# Patient Record
Sex: Male | Born: 1985 | Race: Black or African American | Hispanic: No | Marital: Single | Smoking: Never smoker
Health system: Southern US, Community
[De-identification: ages and names within clinical notes are randomized; demographics above are authoritative.]

## PROBLEM LIST (undated history)

## (undated) DIAGNOSIS — N62 Hypertrophy of breast: Secondary | ICD-10-CM

## (undated) DIAGNOSIS — T7840XA Allergy, unspecified, initial encounter: Secondary | ICD-10-CM

## (undated) DIAGNOSIS — R945 Abnormal results of liver function studies: Secondary | ICD-10-CM

## (undated) DIAGNOSIS — R7989 Other specified abnormal findings of blood chemistry: Secondary | ICD-10-CM

## (undated) HISTORY — DX: Hypertrophy of breast: N62

## (undated) HISTORY — DX: Allergy, unspecified, initial encounter: T78.40XA

---
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
## 2012-04-05 ENCOUNTER — Other Ambulatory Visit: Payer: Self-pay | Admitting: Family Medicine

## 2012-04-18 ENCOUNTER — Ambulatory Visit (HOSPITAL_COMMUNITY)
Admission: RE | Admit: 2012-04-18 | Discharge: 2012-04-18 | Disposition: A | Discharge status: Home / Self Care | Payer: 59 | Source: Ambulatory Visit | Attending: Family Medicine | Admitting: Family Medicine

## 2012-04-18 DIAGNOSIS — N63 Unspecified lump in unspecified breast: Secondary | ICD-10-CM | POA: Insufficient documentation

## 2012-11-19 ENCOUNTER — Ambulatory Visit (INDEPENDENT_AMBULATORY_CARE_PROVIDER_SITE_OTHER): Payer: 59 | Admitting: Physician Assistant

## 2012-11-19 VITALS — BP 118/72 | HR 62 | Temp 98.1°F | Resp 16 | Ht 67.0 in | Wt 191.0 lb

## 2012-11-19 DIAGNOSIS — R42 Dizziness and giddiness: Secondary | ICD-10-CM

## 2012-11-19 DIAGNOSIS — J069 Acute upper respiratory infection, unspecified: Secondary | ICD-10-CM

## 2012-11-19 MED ORDER — TRIAMCINOLONE ACETONIDE(NASAL) 55 MCG/ACT NA INHA: 2.0000 | Freq: Every day | NASAL | Status: DC

## 2012-11-19 MED ORDER — GUAIFENESIN ER 1200 MG PO TB12: 1.0000 | ORAL_TABLET | Freq: Two times a day (BID) | ORAL | Status: AC

## 2012-11-19 NOTE — Progress Notes (Signed)
  Subjective:    Patient ID: Tyler Brooks, male    DOB: August 04, 1985, 27 y.o.   MRN: [id number]  HPI Tyler Brooks is a 27 YO African American male presenting with dizziness which started yesterday (Sunday).  He describes feeling like he is about to fall - but denies vertigo and lightheadedness.  The sensation is relieved when he sits down.   Two days ago (Saturday), his allergies were worse.  He had increased sneezing and nasal discharge.  Used generic Allergy Tablets with improvement.    He is also experiencing a subjective fever, nausea, belching, and regurgitation.  He had a bout of constipation last week but had a bowel movement this morning.  His normal BM schedule is every other day.  Denies tinnitus, ear pain, vomiting, abdominal pain, anorexia, heartburn, chest pain, shortness of breath, diarrhea.  No hearing loss.  No HA.  Past medical history, surgical history, medications, allergies, social history and family history have been reviewed.  Review of Systems As stated in HPI - otherwise negative.    Objective:   Physical Exam Filed Vitals:   11/19/12 1215  BP: 118/72  Pulse: 62  Temp: 98.1 F (36.7 C)  TempSrc: Oral  Resp: 16  Height: 5\' 7"  (1.702 m)  Weight: 191 lb (86.637 kg)  SpO2: 100%   General:  WDWN male in no acute distress. Skin:  Soft, warm skin with good turgor.  No bruising or lesions present.  HEENT:   Head - normocephalic, no visible or palpable masses.  Eyes - conjunctivae clear, sclera white.    Ears - EACs have moderate cerumen present.  Right TMs was mildly dull.  Hearing intact.   Nose - Patent.  Nasal mucosa is pale and boggy - R>L.  Septum midline.   Mouth/Throat - Mucous membranes are moist and pink.  No obvious periodontal disease.   No tonsillar hypertrophy or exudate.   Neck - supple without lymphadenopathy or thyromegaly. Cardiovascular: S1 and S2 distinct with no murmurs, rubs or gallops. Respiratory:  CTA with no adventitious  sounds. Abdominal:  Soft, non-tender abdomen with no visible pulsations or masses.  Bowel sounds adequate.   Neuro:  Cranial nerves grossly intact.  No nystagmus present.  Negative romberg.    Assessment & Plan:  1. Dizziness 2. URI, acute  I am unsure the exact etiology of his dizziness but his exam is reassuring that this is related to either a cold or allergies.  He will continue to hydrate to decrease chances of dehydration causing worsening symptoms and use meds as directed to treat his congestion which is most likely causing ETD resulting in a dizzy sensation for the patient.  Prescribe: - Guaifenesin (MUCINEX MAXIMUM STRENGTH) 1200 MG TB12; Take 1 tablet (1,200 mg total) by mouth 2 (two) times daily.  Dispense: 14 each; Refill: 0 - triamcinolone (NASACORT) 55 MCG/ACT nasal inhaler; Place 2 sprays into the nose daily. 2 sprays each nostril daily.  Dispense: 1 Inhaler; Refill: 0  Stay hydrated.  Work note for today given.  Call office if symptoms do not steadily improve or any acute worsening.

## 2012-11-19 NOTE — Progress Notes (Signed)
I was directly involved with the patient care. I examined the patient and was involved in the treatment plan.

## 2013-02-19 ENCOUNTER — Other Ambulatory Visit: Payer: Self-pay | Admitting: Physical Medicine and Rehabilitation

## 2013-02-19 ENCOUNTER — Ambulatory Visit
Admission: RE | Admit: 2013-02-19 | Discharge: 2013-02-19 | Disposition: A | Discharge status: Home / Self Care | Payer: No Typology Code available for payment source | Source: Ambulatory Visit | Attending: Physical Medicine and Rehabilitation | Admitting: Physical Medicine and Rehabilitation

## 2013-02-19 DIAGNOSIS — Z0000 Encounter for general adult medical examination without abnormal findings: Secondary | ICD-10-CM

## 2013-09-17 ENCOUNTER — Ambulatory Visit (INDEPENDENT_AMBULATORY_CARE_PROVIDER_SITE_OTHER): Payer: 59 | Admitting: Family Medicine

## 2013-09-17 ENCOUNTER — Encounter: Payer: Self-pay | Admitting: Family Medicine

## 2013-09-17 VITALS — BP 122/68 | HR 76 | Temp 99.0°F | Resp 14 | Ht 68.0 in | Wt 171.0 lb

## 2013-09-17 DIAGNOSIS — Z0000 Encounter for general adult medical examination without abnormal findings: Secondary | ICD-10-CM

## 2013-09-17 NOTE — Progress Notes (Signed)
Subjective:    Patient ID: Tyler Brooks, male    DOB: 1986-05-04, 28 y.o.   MRN: [id number]  HPI Patient today for complete physical exam. He has no concerns. He is very healthy. Since his last office visit the patient has lost substantial weight. He is producing greater than 200 pounds. Today he is 171 pounds and has a BMI of 26. He is exercising regularly. He does not smoke. He does not abuse alcohol. Past Medical History  Diagnosis Date  . Allergy   . Gynecomastia, male 2000    bilateral, R > L - "getting better as a result of weight loss"   Current Outpatient Prescriptions on File Prior to Visit  Medication Sig Dispense Refill  . ibuprofen (ADVIL,MOTRIN) 100 MG tablet Take 100 mg by mouth every 6 (six) hours as needed for fever.       No current facility-administered medications on file prior to visit.   No Known Allergies No past surgical history on file. History   Social History  . Marital Status: Single    Spouse Name: N/A    Number of Children: N/A  . Years of Education: N/A   Occupational History  .      Case Picker, [employer]   Social History Main Topics  . Smoking status: Never Smoker   . Smokeless tobacco: Never Used  . Alcohol Use: Yes     Comment: Occas.  . Drug Use: No  . Sexual Activity: Not on file     Comment: works at [employer], 1 child   Other Topics Concern  . Not on file   Social History Narrative   Lives with girlfriend   Family History  Problem Relation Age of Onset  . Diverticulitis Mother   . Diabetes Father   . Hypertension Father   . Hypertension Paternal Grandmother   . Diabetes Paternal Grandmother   . Cancer Maternal Aunt 45    breast  . COPD Maternal Grandmother       Review of Systems  All other systems reviewed and are negative.      Objective:   Physical Exam  Vitals reviewed. Constitutional: He is oriented to person, place, and time. He appears well-developed and well-nourished. No distress.  HENT:   Head: Normocephalic and atraumatic.  Right Ear: External ear normal.  Left Ear: External ear normal.  Nose: Nose normal.  Mouth/Throat: No oropharyngeal exudate.  Eyes: Conjunctivae and EOM are normal. Pupils are equal, round, and reactive to light. Right eye exhibits no discharge. Left eye exhibits no discharge. No scleral icterus.  Neck: Normal range of motion. Neck supple. No JVD present. No tracheal deviation present. No thyromegaly present.  Cardiovascular: Normal rate, regular rhythm, normal heart sounds and intact distal pulses.  Exam reveals no gallop and no friction rub.   No murmur heard. Pulmonary/Chest: Effort normal and breath sounds normal. No stridor. No respiratory distress. He has no wheezes. He has no rales. He exhibits no tenderness.  Abdominal: Soft. Bowel sounds are normal. He exhibits no distension and no mass. There is no tenderness. There is no rebound and no guarding.  Genitourinary: Penis normal. Guaiac negative stool. No penile tenderness.  Musculoskeletal: Normal range of motion. He exhibits no edema and no tenderness.  Lymphadenopathy:    He has no cervical adenopathy.  Neurological: He is alert and oriented to person, place, and time. He has normal reflexes. He displays normal reflexes. No cranial nerve deficit. He exhibits normal muscle tone. Coordination  normal.  Skin: Skin is warm. No rash noted. He is not diaphoretic. No erythema. No pallor.  Psychiatric: He has a normal mood and affect. His behavior is normal. Judgment and thought content normal.   patient has several keloids.        Assessment & Plan:  1. Routine general medical examination at a health care facility Physical exam is completely normal. His blood pressure is excellent. His tetanus vaccine is up to date. The remainder of his preventative care is normal. His testicular exam is normal. I recommended he return fasting for CBC, CMP, and fasting lipid panel. Otherwise recheck in one year.

## 2013-09-26 ENCOUNTER — Other Ambulatory Visit: Payer: 59

## 2013-09-26 LAB — CBC WITH DIFFERENTIAL/PLATELET
Basophils Absolute: 0 10*3/uL (ref 0.0–0.1)
Basophils Relative: 1 % (ref 0–1)
EOS ABS: 0.2 10*3/uL (ref 0.0–0.7)
EOS PCT: 5 % (ref 0–5)
HCT: 39.3 % (ref 39.0–52.0)
HEMOGLOBIN: 13.8 g/dL (ref 13.0–17.0)
LYMPHS ABS: 1.8 10*3/uL (ref 0.7–4.0)
LYMPHS PCT: 61 % — AB (ref 12–46)
MCH: 31.2 pg (ref 26.0–34.0)
MCHC: 35.1 g/dL (ref 30.0–36.0)
MCV: 88.9 fL (ref 78.0–100.0)
MONOS PCT: 7 % (ref 3–12)
Monocytes Absolute: 0.2 10*3/uL (ref 0.1–1.0)
Neutro Abs: 0.8 10*3/uL — ABNORMAL LOW (ref 1.7–7.7)
Neutrophils Relative %: 26 % — ABNORMAL LOW (ref 43–77)
Platelets: 242 10*3/uL (ref 150–400)
RBC: 4.42 MIL/uL (ref 4.22–5.81)
RDW: 13.2 % (ref 11.5–15.5)
WBC: 3 10*3/uL — AB (ref 4.0–10.5)

## 2013-09-26 LAB — COMPLETE METABOLIC PANEL WITH GFR
ALT: 41 U/L (ref 0–53)
AST: 43 U/L — ABNORMAL HIGH (ref 0–37)
Albumin: 4.6 g/dL (ref 3.5–5.2)
Alkaline Phosphatase: 59 U/L (ref 39–117)
BUN: 12 mg/dL (ref 6–23)
CO2: 30 mEq/L (ref 19–32)
Calcium: 9.5 mg/dL (ref 8.4–10.5)
Chloride: 101 mEq/L (ref 96–112)
Creat: 1 mg/dL (ref 0.50–1.35)
GFR, Est African American: >89 mL/min
GFR, Est Non African American: >89 mL/min
Glucose, Bld: 76 mg/dL (ref 70–99)
Potassium: 4.4 mEq/L (ref 3.5–5.3)
Sodium: 138 mEq/L (ref 135–145)
Total Bilirubin: 0.6 mg/dL (ref 0.2–1.2)
Total Protein: 7.3 g/dL (ref 6.0–8.3)

## 2013-09-26 LAB — LIPID PANEL
Cholesterol: 128 mg/dL (ref 0–200)
HDL: 66 mg/dL (ref >39)
LDL Cholesterol: 46 mg/dL (ref 0–99)
Total CHOL/HDL Ratio: 1.9 Ratio
Triglycerides: 81 mg/dL (ref <150)
VLDL: 16 mg/dL (ref 0–40)

## 2015-01-08 ENCOUNTER — Encounter: Payer: Self-pay | Admitting: Family Medicine

## 2015-01-29 ENCOUNTER — Ambulatory Visit (INDEPENDENT_AMBULATORY_CARE_PROVIDER_SITE_OTHER): Payer: 59 | Admitting: Family Medicine

## 2015-01-29 ENCOUNTER — Other Ambulatory Visit: Payer: Self-pay | Admitting: Family Medicine

## 2015-01-29 ENCOUNTER — Encounter: Payer: Self-pay | Admitting: Family Medicine

## 2015-01-29 VITALS — BP 114/70 | HR 68 | Temp 98.3°F | Resp 18 | Ht 67.75 in | Wt 182.0 lb

## 2015-01-29 DIAGNOSIS — Z0000 Encounter for general adult medical examination without abnormal findings: Secondary | ICD-10-CM

## 2015-01-29 DIAGNOSIS — Z7251 High risk heterosexual behavior: Secondary | ICD-10-CM

## 2015-01-29 DIAGNOSIS — K644 Residual hemorrhoidal skin tags: Secondary | ICD-10-CM

## 2015-01-29 DIAGNOSIS — K648 Other hemorrhoids: Secondary | ICD-10-CM | POA: Diagnosis not present

## 2015-01-29 LAB — COMPLETE METABOLIC PANEL WITH GFR
ALT: 40 U/L (ref 9–46)
AST: 104 U/L — ABNORMAL HIGH (ref 10–40)
Albumin: 4.2 g/dL (ref 3.6–5.1)
Alkaline Phosphatase: 46 U/L (ref 40–115)
BUN: 14 mg/dL (ref 7–25)
CO2: 29 mmol/L (ref 20–31)
CREATININE: 0.94 mg/dL (ref 0.60–1.35)
Calcium: 9.2 mg/dL (ref 8.6–10.3)
Chloride: 103 mmol/L (ref 98–110)
GFR, Est Non African American: >89 mL/min (ref >=60)
Glucose, Bld: 90 mg/dL (ref 70–99)
Potassium: 3.9 mmol/L (ref 3.5–5.3)
Sodium: 139 mmol/L (ref 135–146)
Total Bilirubin: 0.8 mg/dL (ref 0.2–1.2)
Total Protein: 6.8 g/dL (ref 6.1–8.1)

## 2015-01-29 LAB — LIPID PANEL
Cholesterol: 152 mg/dL (ref 125–200)
HDL: 77 mg/dL (ref >=40)
LDL Cholesterol: 66 mg/dL (ref <130)
TRIGLYCERIDES: 43 mg/dL (ref <150)
Total CHOL/HDL Ratio: 2 Ratio (ref <=5.0)
VLDL: 9 mg/dL (ref <30)

## 2015-01-29 MED ORDER — HYDROCORTISONE 2.5 % RE CREA: 1.0000 "application " | TOPICAL_CREAM | Freq: Two times a day (BID) | RECTAL | Status: DC

## 2015-01-29 NOTE — Progress Notes (Signed)
Subjective:    Patient ID: Tyler Brooks, male    DOB: 1985/08/15, 29 y.o.   MRN: [id number]  HPI  Patient today for complete physical exam. He has no concerns. He is very healthy. Patient is trying to become a model. He is exercising regularly. He is lifting weights however he is doing it in a healthy fashion alternating muscle groups and resting his body in between. He is also focusing on healthy diet filled with healthy proteins. His only concern is occasional bright red blood per rectum. On physical exam today there is a external hemorrhoid at 5:00 externally which is likely the source of this. He is also requesting STD testing although he is asymptomatic. Past Medical History  Diagnosis Date  . Allergy   . Gynecomastia, male 2000    bilateral, R > L - "getting better as a result of weight loss"   Current Outpatient Prescriptions on File Prior to Visit  Medication Sig Dispense Refill  . ibuprofen (ADVIL,MOTRIN) 100 MG tablet Take 100 mg by mouth every 6 (six) hours as needed for fever.     No current facility-administered medications on file prior to visit.   No Known Allergies No past surgical history on file. Social History   Social History  . Marital Status: Single    Spouse Name: N/A  . Number of Children: N/A  . Years of Education: N/A   Occupational History  .      Case Picker, [employer]   Social History Main Topics  . Smoking status: Never Smoker   . Smokeless tobacco: Never Used  . Alcohol Use: Yes     Comment: Occas.  . Drug Use: No  . Sexual Activity: Not on file     Comment: works at [employer], 1 child   Other Topics Concern  . Not on file   Social History Narrative   Lives with girlfriend   Family History  Problem Relation Age of Onset  . Diverticulitis Mother   . Diabetes Father   . Hypertension Father   . Hypertension Paternal Grandmother   . Diabetes Paternal Grandmother   . Cancer Maternal Aunt 45    breast  . COPD Maternal  Grandmother       Review of Systems  All other systems reviewed and are negative.      Objective:   Physical Exam  Constitutional: He is oriented to person, place, and time. He appears well-developed and well-nourished. No distress.  HENT:  Head: Normocephalic and atraumatic.  Right Ear: External ear normal.  Left Ear: External ear normal.  Nose: Nose normal.  Mouth/Throat: No oropharyngeal exudate.  Eyes: Conjunctivae and EOM are normal. Pupils are equal, round, and reactive to light. Right eye exhibits no discharge. Left eye exhibits no discharge. No scleral icterus.  Neck: Normal range of motion. Neck supple. No JVD present. No tracheal deviation present. No thyromegaly present.  Cardiovascular: Normal rate, regular rhythm, normal heart sounds and intact distal pulses.  Exam reveals no gallop and no friction rub.   No murmur heard. Pulmonary/Chest: Effort normal and breath sounds normal. No stridor. No respiratory distress. He has no wheezes. He has no rales. He exhibits no tenderness.  Abdominal: Soft. Bowel sounds are normal. He exhibits no distension and no mass. There is no tenderness. There is no rebound and no guarding.  Genitourinary: Penis normal. Guaiac negative stool. No penile tenderness.  Musculoskeletal: Normal range of motion. He exhibits no edema or tenderness.  Lymphadenopathy:  He has no cervical adenopathy.  Neurological: He is alert and oriented to person, place, and time. He has normal reflexes. No cranial nerve deficit. He exhibits normal muscle tone. Coordination normal.  Skin: Skin is warm. No rash noted. He is not diaphoretic. No erythema. No pallor.  Psychiatric: He has a normal mood and affect. His behavior is normal. Judgment and thought content normal.  Vitals reviewed.  patient has several keloids.        Assessment & Plan:  1. Routine general medical examination at a health care facility Physical exam is completely normal. His blood pressure  is excellent. His tetanus vaccine is up to date. The remainder of his preventative care is normal. His testicular exam is normal.  I will check a CBC, CMP, fasting lipid panel. I gave the patient prescription for Anusol HC cream to be applied twice daily for up to one week for the external hemorrhoid. I will also perform STD testing per the patient's request. He declines a flu shot.

## 2015-01-29 NOTE — Addendum Note (Signed)
Addended by: Alean Rinne A on: 01/29/2015 08:37 AM   Modules accepted: Orders

## 2015-01-30 LAB — CBC WITH DIFFERENTIAL/PLATELET
Basophils Absolute: 0 10*3/uL (ref 0.0–0.1)
Basophils Relative: 1 % (ref 0–1)
EOS ABS: 0.1 10*3/uL (ref 0.0–0.7)
Eosinophils Relative: 3 % (ref 0–5)
HEMATOCRIT: 37.7 % — AB (ref 39.0–52.0)
Hemoglobin: 13.6 g/dL (ref 13.0–17.0)
LYMPHS ABS: 1.4 10*3/uL (ref 0.7–4.0)
LYMPHS PCT: 56 % — AB (ref 12–46)
MCH: 32.6 pg (ref 26.0–34.0)
MCHC: 36.1 g/dL — ABNORMAL HIGH (ref 30.0–36.0)
MCV: 90.4 fL (ref 78.0–100.0)
MPV: 10 fL (ref 8.6–12.4)
Monocytes Absolute: 0.2 10*3/uL (ref 0.1–1.0)
Monocytes Relative: 9 % (ref 3–12)
NEUTROS ABS: 0.8 10*3/uL — AB (ref 1.7–7.7)
NEUTROS PCT: 31 % — AB (ref 43–77)
PLATELETS: 217 10*3/uL (ref 150–400)
RBC: 4.17 MIL/uL — ABNORMAL LOW (ref 4.22–5.81)
RDW: 13.2 % (ref 11.5–15.5)
WBC: 2.5 10*3/uL — ABNORMAL LOW (ref 4.0–10.5)

## 2015-01-30 LAB — PATHOLOGIST SMEAR REVIEW

## 2015-01-30 LAB — GC/CHLAMYDIA PROBE AMP, URINE
CHLAMYDIA, SWAB/URINE, PCR: NEGATIVE
GC PROBE AMP, URINE: NEGATIVE

## 2015-01-30 LAB — RPR

## 2015-01-30 LAB — HIV ANTIBODY (ROUTINE TESTING W REFLEX): HIV: NONREACTIVE

## 2015-02-03 ENCOUNTER — Other Ambulatory Visit: Payer: 59

## 2015-02-03 DIAGNOSIS — R7989 Other specified abnormal findings of blood chemistry: Secondary | ICD-10-CM

## 2015-02-03 DIAGNOSIS — R945 Abnormal results of liver function studies: Principal | ICD-10-CM

## 2015-02-03 LAB — HEPATITIS PANEL, ACUTE
HCV AB: NEGATIVE
HCV AB: NEGATIVE
HEP A IGM: NONREACTIVE
HEP B C IGM: NONREACTIVE
Hepatitis B Surface Ag: NEGATIVE

## 2015-12-08 ENCOUNTER — Ambulatory Visit (INDEPENDENT_AMBULATORY_CARE_PROVIDER_SITE_OTHER): Payer: 59 | Admitting: Family Medicine

## 2015-12-08 ENCOUNTER — Encounter: Payer: Self-pay | Admitting: Family Medicine

## 2015-12-08 VITALS — BP 110/76 | HR 76 | Temp 98.6°F | Resp 14 | Ht 68.0 in | Wt 207.0 lb

## 2015-12-08 DIAGNOSIS — R7989 Other specified abnormal findings of blood chemistry: Secondary | ICD-10-CM | POA: Diagnosis not present

## 2015-12-08 DIAGNOSIS — Z7251 High risk heterosexual behavior: Secondary | ICD-10-CM

## 2015-12-08 DIAGNOSIS — R945 Abnormal results of liver function studies: Secondary | ICD-10-CM

## 2015-12-08 NOTE — Progress Notes (Signed)
Subjective:    Patient ID: Tyler Brooks, male    DOB: 1986-03-22, 30 y.o.   MRN: [id number]  HPI  9/16 Patient today for complete physical exam. He has no concerns. He is very healthy. Patient is trying to become a model. He is exercising regularly. He is lifting weights however he is doing it in a healthy fashion alternating muscle groups and resting his body in between. He is also focusing on healthy diet filled with healthy proteins. His only concern is occasional bright red blood per rectum. On physical exam today there is a external hemorrhoid at 5:00 externally which is likely the source of this. He is also requesting STD testing although he is asymptomatic.  At that time, my plan was: 1. Routine general medical examination at a health care facility Physical exam is completely normal. His blood pressure is excellent. His tetanus vaccine is up to date. The remainder of his preventative care is normal. His testicular exam is normal.  I will check a CBC, CMP, fasting lipid panel. I gave the patient prescription for Anusol HC cream to be applied twice daily for up to one week for the external hemorrhoid. I will also perform STD testing per the patient's request. He declines a flu shot.  12/08/15 At the last appt, The patient was found to have significant elevations in his AST. Viral hepatitis tests were normal. Never got right upper quadrant ultrasound. I assumed AST was elevated due to muscle inflammation as he was actively bodybuilding at the time. He is not drinking alcohol. He is not taking any Tylenol. I would like to repeat that today. He is also requesting screening for all STDs. He is asymptomatic but he is starting a new relationship and would like testing prior to starting a relationship Past Medical History:  Diagnosis Date  . Allergy   . Gynecomastia, male 2000   bilateral, R > L - "getting better as a result of weight loss"   No current outpatient prescriptions on file prior  to visit.   No current facility-administered medications on file prior to visit.    No Known Allergies No past surgical history on file. Social History   Social History  . Marital status: Single    Spouse name: N/A  . Number of children: N/A  . Years of education: N/A   Occupational History  .      Case Picker, [employer]   Social History Main Topics  . Smoking status: Never Smoker  . Smokeless tobacco: Never Used  . Alcohol use Yes     Comment: Occas.  . Drug use: No  . Sexual activity: Not on file     Comment: works at [employer], 1 child   Other Topics Concern  . Not on file   Social History Narrative   Lives with girlfriend   Family History  Problem Relation Age of Onset  . Diverticulitis Mother   . Diabetes Father   . Hypertension Father   . Hypertension Paternal Grandmother   . Diabetes Paternal Grandmother   . Cancer Maternal Aunt 45    breast  . COPD Maternal Grandmother       Review of Systems  All other systems reviewed and are negative.      Objective:   Physical Exam  Constitutional: He is oriented to person, place, and time. He appears well-developed and well-nourished. No distress.  HENT:  Head: Normocephalic and atraumatic.  Right Ear: External ear normal.  Left Ear:  External ear normal.  Nose: Nose normal.  Mouth/Throat: No oropharyngeal exudate.  Eyes: Conjunctivae and EOM are normal. Pupils are equal, round, and reactive to light. Right eye exhibits no discharge. Left eye exhibits no discharge. No scleral icterus.  Neck: Normal range of motion. Neck supple. No JVD present. No tracheal deviation present. No thyromegaly present.  Cardiovascular: Normal rate, regular rhythm, normal heart sounds and intact distal pulses.  Exam reveals no gallop and no friction rub.   No murmur heard. Pulmonary/Chest: Effort normal and breath sounds normal. No stridor. No respiratory distress. He has no wheezes. He has no rales. He exhibits no  tenderness.  Abdominal: Soft. Bowel sounds are normal. He exhibits no distension and no mass. There is no tenderness. There is no rebound and no guarding.  Genitourinary: Penis normal. Rectal exam shows guaiac negative stool. No penile tenderness.  Musculoskeletal: Normal range of motion. He exhibits no edema or tenderness.  Lymphadenopathy:    He has no cervical adenopathy.  Neurological: He is alert and oriented to person, place, and time. He has normal reflexes. No cranial nerve deficit. He exhibits normal muscle tone. Coordination normal.  Skin: Skin is warm. No rash noted. He is not diaphoretic. No erythema. No pallor.  Psychiatric: He has a normal mood and affect. His behavior is normal. Judgment and thought content normal.  Vitals reviewed.  patient has several keloids.        Assessment & Plan:  High risk sexual behavior - Plan: GC/Chlamydia Probe Amp, RPR, HIV antibody, HSV(herpes smplx)abs-1+2(IgG+IgM)-bld  Elevated LFTs - Plan: COMPLETE METABOLIC PANEL WITH GFR  In accordance with his wishes I will screen the patient for STDs including HIV, syphilis, HSV, gonorrhea, chlamydia. I will also repeat a CMP to monitor his liver function tests and his AST remains elevated, I would obtain a right upper quadrant ultrasound

## 2015-12-09 LAB — SYPHILIS: RPR W/REFLEX TO RPR TITER AND TREPONEMAL ANTIBODIES, TRADITIONAL SCREENING AND DIAGNOSIS ALGORITHM

## 2015-12-09 LAB — COMPLETE METABOLIC PANEL WITH GFR
ALT: 26 U/L (ref 9–46)
AST: 26 U/L (ref 10–40)
Albumin: 4.4 g/dL (ref 3.6–5.1)
Alkaline Phosphatase: 63 U/L (ref 40–115)
BILIRUBIN TOTAL: 0.8 mg/dL (ref 0.2–1.2)
BUN: 16 mg/dL (ref 7–25)
CO2: 27 mmol/L (ref 20–31)
Calcium: 8.9 mg/dL (ref 8.6–10.3)
Chloride: 104 mmol/L (ref 98–110)
Creat: 1.28 mg/dL (ref 0.60–1.35)
GFR, EST AFRICAN AMERICAN: 86 mL/min (ref >=60)
GFR, EST NON AFRICAN AMERICAN: 75 mL/min (ref >=60)
Glucose, Bld: 69 mg/dL — ABNORMAL LOW (ref 70–99)
Potassium: 4.5 mmol/L (ref 3.5–5.3)
Sodium: 140 mmol/L (ref 135–146)
Total Protein: 7 g/dL (ref 6.1–8.1)

## 2015-12-09 LAB — GC/CHLAMYDIA PROBE AMP
CT PROBE, AMP APTIMA: NOT DETECTED
GC PROBE AMP APTIMA: NOT DETECTED

## 2015-12-09 LAB — HIV ANTIBODY (ROUTINE TESTING W REFLEX): HIV: NONREACTIVE

## 2015-12-10 LAB — HSV(HERPES SMPLX)ABS-I+II(IGG+IGM)-BLD
HSV 1 Glycoprotein G Ab, IgG: <0.9 {index}
HSV 2 Glycoprotein G Ab, IgG: <0.9 {index}
Herpes Simplex Vrs I&II-IgM Ab (EIA): 0.23 {index}

## 2016-02-01 ENCOUNTER — Encounter: Payer: 59 | Admitting: Family Medicine

## 2016-05-10 ENCOUNTER — Emergency Department (HOSPITAL_BASED_OUTPATIENT_CLINIC_OR_DEPARTMENT_OTHER): Payer: Worker's Compensation

## 2016-05-10 ENCOUNTER — Encounter (HOSPITAL_BASED_OUTPATIENT_CLINIC_OR_DEPARTMENT_OTHER): Payer: Self-pay | Admitting: Emergency Medicine

## 2016-05-10 ENCOUNTER — Emergency Department (HOSPITAL_BASED_OUTPATIENT_CLINIC_OR_DEPARTMENT_OTHER)
Admission: EM | Admit: 2016-05-10 | Discharge: 2016-05-11 | Disposition: A | Discharge status: Home / Self Care | Payer: Worker's Compensation | Attending: Emergency Medicine | Admitting: Emergency Medicine

## 2016-05-10 DIAGNOSIS — Y929 Unspecified place or not applicable: Secondary | ICD-10-CM | POA: Diagnosis not present

## 2016-05-10 DIAGNOSIS — S93402A Sprain of unspecified ligament of left ankle, initial encounter: Secondary | ICD-10-CM

## 2016-05-10 DIAGNOSIS — X501XXA Overexertion from prolonged static or awkward postures, initial encounter: Secondary | ICD-10-CM | POA: Diagnosis not present

## 2016-05-10 DIAGNOSIS — Y990 Civilian activity done for income or pay: Secondary | ICD-10-CM | POA: Diagnosis not present

## 2016-05-10 DIAGNOSIS — Y9389 Activity, other specified: Secondary | ICD-10-CM | POA: Insufficient documentation

## 2016-05-10 DIAGNOSIS — S99912A Unspecified injury of left ankle, initial encounter: Secondary | ICD-10-CM | POA: Diagnosis present

## 2016-05-10 HISTORY — DX: Other specified abnormal findings of blood chemistry: R79.89

## 2016-05-10 HISTORY — DX: Abnormal results of liver function studies: R94.5

## 2016-05-10 MED ORDER — OXYCODONE-ACETAMINOPHEN 5-325 MG PO TABS
1.0000 | ORAL_TABLET | Freq: Once | ORAL | Status: AC
  Administered 2016-05-10: 1 via ORAL
  Filled 2016-05-10: qty 1

## 2016-05-10 NOTE — ED Notes (Signed)
Pts Supervisor states no drug screen needed

## 2016-05-10 NOTE — ED Triage Notes (Signed)
Pt c/o left ankle injury at work x 2 hrs ago.

## 2016-05-10 NOTE — ED Provider Notes (Signed)
[facility] Note   CSN: [id number] Arrival date & time: 05/10/16  2304  By signing my name below, I, Tyler Brooks, attest that this documentation has been prepared under the direction and in the presence of non-physician practitioner, Audry Piliyler Camden Knotek, PA-C. Electronically Signed: Majel HomerPeyton Brooks, Scribe. 05/10/2016. 11:52 PM.  History   Chief Complaint Chief Complaint  Patient presents with  . Ankle Injury   The history is provided by the patient. No language interpreter was used.   HPI Comments: Tyler Brooks is a 30 y.o. male who presents to the Emergency Department complaining of gradually worsening, left ankle pain s/p an injury that occurred at work ~2 hours PTA. 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 He notes he has not taken any medication to relieve his pain. Pt denies numbness or tingling to his extremities.   Past Medical History:  Diagnosis Date  . Allergy   . Elevated LFTs   . Gynecomastia, male 2000   bilateral, R > L - "getting better as a result of weight loss"   There are no active problems to display for this patient.  History reviewed. No pertinent surgical history.  Home Medications    Prior to Admission medications   Not on File   Family History Family History  Problem Relation Age of Onset  . Diverticulitis Mother   . Diabetes Father   . Hypertension Father   . Hypertension Paternal Grandmother   . Diabetes Paternal Grandmother   . Cancer Maternal Aunt 45    breast  . COPD Maternal Grandmother     Social History Social History  Substance Use Topics  . Smoking status: Never Smoker  . Smokeless tobacco: Never Used  . Alcohol use Yes     Comment: Occas.   Allergies   Patient has no known allergies.  Review of Systems Review of Systems    Musculoskeletal: Positive for arthralgias.  Neurological: Negative for numbness.   Physical Exam Updated Vital Signs BP 148/72   Pulse 78   Temp 98.4 F (36.9 C)   Resp 16   Ht 5\' 8"  (1.727 m)   Wt 210 lb (95.3 kg)   SpO2 100%   BMI 31.93 kg/m   Physical Exam  Constitutional: He is oriented to person, place, and time. He appears well-developed and well-nourished.  HENT:  Head: Normocephalic.  Eyes: EOM are normal.  Neck: Normal range of motion.  Pulmonary/Chest: Effort normal.  Abdominal: He exhibits no distension.  Musculoskeletal: Normal range of motion. He exhibits tenderness. He exhibits no deformity.  TTP superior and anterior to lateral malleolus. ROM intact, neurovascularly intact, motor sensation intact. No visible swelling or erythema, no obvious palpable or visible deformities.   Neurological: He is alert and oriented to person, place, and time.  Psychiatric: He has a normal mood and affect.  Nursing note and vitals reviewed.  ED Treatments / Results  Labs (all labs ordered are listed, but only abnormal results are displayed) Labs Reviewed - No data to display  EKG  EKG Interpretation None      Radiology No results found.  Procedures Procedures (including critical care time)  Medications Ordered in ED Medications - No data to display  DIAGNOSTIC STUDIES:  Oxygen Saturation is 100% on RA, normal by my interpretation.  COORDINATION OF CARE:  11:49 PM Discussed treatment plan with pt at bedside and pt agreed to plan.  Initial Impression / Assessment and Plan / ED Course  I have reviewed the triage vital signs and the nursing notes.  Pertinent labs & imaging results that were available during my care of the patient were reviewed by me and considered in my medical decision making (see chart for details).  Clinical Course     Final Clinical Impressions(s) / ED Diagnoses    {I have reviewed and evaluated the relevant imaging studies.  {I have  reviewed the relevant previous healthcare records.  {I obtained HPI from historian.   ED Course:  Assessment: Patient X-Ray negative for obvious fracture or dislocation.  Likely sprain. Pt advised to follow up with PCP. Patient given brace and crutches while in ED, conservative therapy recommended and discussed. Patient will be discharged home & is agreeable with above plan. Returns precautions discussed. Pt appears safe for discharge.  Disposition/Plan:  DC Home Additional Verbal discharge instructions given and discussed with patient.  Pt Instructed to f/u with PCP in the next week for evaluation and treatment of symptoms. Return precautions given Pt acknowledges and agrees with plan  Supervising Physician Zadie Rhineonald Wickline, MD  Final diagnoses:  Sprain of left ankle, unspecified ligament, initial encounter    New Prescriptions New Prescriptions   No medications on file     Audry Piliyler Cornell Gaber, PA-C 05/11/16 [id number]    Zadie Rhineonald Wickline, MD 05/11/16 [phone]

## 2016-05-10 NOTE — ED Notes (Signed)
Patient transported to X-ray 

## 2016-05-11 MED ORDER — IBUPROFEN 600 MG PO TABS: 600.0000 mg | ORAL_TABLET | Freq: Four times a day (QID) | ORAL | 0 refills | Status: AC | PRN

## 2016-05-11 NOTE — ED Notes (Signed)
Pt verbalizes understanding of d/c instructions and denies any further needs at this time. 

## 2016-05-11 NOTE — Discharge Instructions (Signed)
Please read and follow all provided instructions.  Your diagnoses today include:  1. Sprain of left ankle, unspecified ligament, initial encounter     Tests performed today include: Vital signs. See below for your results today.   Medications prescribed:  Take as prescribed   Home care instructions:  Follow any educational materials contained in this packet.  Follow-up instructions: Please follow-up with your primary care provider for further evaluation of symptoms and treatment   Return instructions:  Please return to the Emergency Department if you do not get better, if you get worse, or new symptoms OR  - Fever (temperature greater than 101.69F)  - Bleeding that does not stop with holding pressure to the area    -Severe pain (please note that you may be more sore the day after your accident)  - Chest Pain  - Difficulty breathing  - Severe nausea or vomiting  - Inability to tolerate food and liquids  - Passing out  - Skin becoming red around your wounds  - Change in mental status (confusion or lethargy)  - New numbness or weakness    Please return if you have any other emergent concerns.  Additional Information:  Your vital signs today were: BP 148/72    Pulse 78    Temp 98.4 F (36.9 C)    Resp 16    Ht 5\' 8"  (1.727 m)    Wt 95.3 kg    SpO2 100%    BMI 31.93 kg/m  If your blood pressure (BP) was elevated above 135/85 this visit, please have this repeated by your doctor within one month. ---------------

## 2016-09-07 ENCOUNTER — Encounter: Payer: Self-pay | Admitting: Family Medicine

## 2016-11-23 ENCOUNTER — Encounter: Payer: Self-pay | Admitting: Family Medicine

## 2016-11-28 ENCOUNTER — Ambulatory Visit (HOSPITAL_COMMUNITY)
Admission: EM | Admit: 2016-11-28 | Discharge: 2016-11-28 | Disposition: A | Discharge status: Home / Self Care | Payer: 59 | Attending: Family Medicine | Admitting: Family Medicine

## 2016-11-28 ENCOUNTER — Encounter (HOSPITAL_COMMUNITY): Payer: Self-pay | Admitting: *Deleted

## 2016-11-28 ENCOUNTER — Telehealth: Payer: Self-pay

## 2016-11-28 DIAGNOSIS — K649 Unspecified hemorrhoids: Secondary | ICD-10-CM

## 2016-11-28 NOTE — Telephone Encounter (Signed)
Patient called and stated he has bleeding in stool when having a bowel movement very painful.Patient also states he has mucus staining his underwear.  Patient states he did not feel it was an emergency to to go to the ER but wanted to be seen today. I explained to Patient I could not get him in today that I would look into scheduling him for 7/17 patient got upset said he needed to be seen today and hung the phone up before I could finish gathering information.

## 2016-11-28 NOTE — ED Triage Notes (Signed)
Rectal  Bleeding  Pain           After  bm       Last  Week     Pt  Has     hemmoriods

## 2016-11-30 ENCOUNTER — Ambulatory Visit (HOSPITAL_COMMUNITY)
Admission: EM | Admit: 2016-11-30 | Discharge: 2016-11-30 | Disposition: A | Discharge status: Home / Self Care | Payer: 59 | Attending: Internal Medicine | Admitting: Internal Medicine

## 2016-11-30 ENCOUNTER — Encounter (HOSPITAL_COMMUNITY): Payer: Self-pay | Admitting: Emergency Medicine

## 2016-11-30 DIAGNOSIS — K645 Perianal venous thrombosis: Secondary | ICD-10-CM

## 2016-11-30 MED ORDER — HYDROCORTISONE ACE-PRAMOXINE 2.5-1 % RE CREA
1.0000 | TOPICAL_CREAM | Freq: Three times a day (TID) | RECTAL | 0 refills | Status: AC
Start: 2016-11-30 — End: ?

## 2016-11-30 MED ORDER — HYDROCODONE-ACETAMINOPHEN 7.5-325 MG PO TABS: 1.0000 | ORAL_TABLET | ORAL | 0 refills | Status: AC | PRN

## 2016-11-30 MED ORDER — LIDOCAINE-EPINEPHRINE (PF) 2 %-1:200000 IJ SOLN
INTRAMUSCULAR | Status: AC
  Filled 2016-11-30: qty 20

## 2016-11-30 NOTE — ED Provider Notes (Signed)
CSN: [id number]     Arrival date & time 11/30/16  1327 History   None    Chief Complaint  Patient presents with  . Follow-up   (Consider location/radiation/quality/duration/timing/severity/associated sxs/prior Treatment) 31 year old male returns 2 days after visiting this urgent care for treatment of hemorrhoid. He states that there is been no improvement since he was here and he did not use the cream since it was only for itching. He denies itching but complains of pain. The pain is actually increasing. He has hemorrhoids or hemorrhoidal pain approximately every 2-3 months. It is chronic and ongoing. He has noticed bleeding from the hemorrhoid today. He is unable to sit down. The pain keeps him from performing his work and other usual daily tasks. The MR from visit 2 days ago is not yet available for examination.      Past Medical History:  Diagnosis Date  . Allergy   . Elevated LFTs   . Gynecomastia, male 2000   bilateral, R > L - "getting better as a result of weight loss"   History reviewed. No pertinent surgical history. Family History  Problem Relation Age of Onset  . Diverticulitis Mother   . Diabetes Father   . Hypertension Father   . Hypertension Paternal Grandmother   . Diabetes Paternal Grandmother   . Cancer Maternal Aunt 45       breast  . COPD Maternal Grandmother    Social History  Substance Use Topics  . Smoking status: Never Smoker  . Smokeless tobacco: Never Used  . Alcohol use Yes     Comment: Occas.    Review of Systems  Constitutional: Negative.   Respiratory: Negative.   Gastrointestinal: Positive for rectal pain. Negative for constipation.  Genitourinary: Negative.   Neurological: Negative.   All other systems reviewed and are negative.   Allergies  Patient has no known allergies.  Home Medications   Prior to Admission medications   Medication Sig Start Date End Date Taking? Authorizing Provider  HYDROcodone-acetaminophen (NORCO)  7.5-325 MG tablet Take 1 tablet by mouth every 4 (four) hours as needed. 11/30/16   Hayden RasmussenMabe, Braxtyn Bojarski, NP  hydrocortisone-pramoxine (ANALPRAM HC) 2.5-1 % rectal cream Place 1 application rectally 3 (three) times daily. 11/30/16   Hayden RasmussenMabe, Milad Bublitz, NP  ibuprofen (ADVIL,MOTRIN) 600 MG tablet Take 1 tablet (600 mg total) by mouth every 6 (six) hours as needed. 05/11/16   Audry PiliMohr, Tyler, PA-C   Meds Ordered and Administered this Visit  Medications - No data to display  BP (!) 117/58 (BP Location: Right Arm)   Pulse 82   Temp 98.4 F (36.9 C) (Oral)   Resp 18   SpO2 99%  No data found.   Physical Exam  Constitutional: He is oriented to person, place, and time. He appears well-developed and well-nourished. No distress.  Neck: Neck supple.  Cardiovascular: Normal rate.   Pulmonary/Chest: Effort normal.  Genitourinary:  Genitourinary Comments: There is a medium size external hemorrhoid approximately 2 and half centimeters in length. Proximally half of this structure is similar to her old hemorrhoidal tag. He other half is tense and bulging. There is blood on the external surface. The lesion is quite tender. It does not appear to be reducible at this time.  Neurological: He is alert and oriented to person, place, and time.  Skin: Skin is warm and dry.  Vitals reviewed.   Urgent Care Course     .Marland Kitchen.Incision and Drainage Date/Time: 11/30/2016 4:12 PM Performed by: Phineas RealMABE, Aracelis Ulrey Authorized by: Dayton ScrapeMURRAY,  Vernona Rieger W   Consent:    Consent obtained:  Verbal   Consent given by:  Patient   Risks discussed:  Bleeding, pain and incomplete drainage Location:    Type:  External thrombosed hemorrhoid   Location:  Anogenital   Anogenital location:  Perirectal Pre-procedure details:    Skin preparation:  Betadine Anesthesia (see MAR for exact dosages):    Anesthesia method:  Local infiltration   Local anesthetic:  Lidocaine 2% WITH epi Procedure type:    Complexity:  Simple Procedure details:    Incision types:   Single straight   Incision depth:  Dermal   Scalpel blade:  15   Drainage:  Bloody   Drainage amount:  Scant   Wound treatment:  Wound left open Post-procedure details:    Patient tolerance of procedure:  Tolerated well, no immediate complications Comments:     There was a very small clot expressed after incision to the external hemorrhoid. After that mostly blood. Palpation and visualization reveals no other clotting however most of the bulk of the external hemorrhoid is a thickened wall. The hemorrhoidal incision was then packed.   (including critical care time)  Labs Review Labs Reviewed - No data to display  Imaging Review No results found.   Visual Acuity Review  Right Eye Distance:   Left Eye Distance:   Bilateral Distance:    Right Eye Near:   Left Eye Near:    Bilateral Near:         MDM   1. External hemorrhoid, thrombosed    Keep the packing on top of the hemorrhoid. Because of the incision made to the external hemorrhoid you have some bleeding for the next couple of days. Be sure to clean the area at least once or twice a day with warm water. Apply the cream prescribed. This should help with shrinking, inflammation and some pain. Pain medicines as directed. Call the surgeon tomorrow to set up an appointment. Meds ordered this encounter  Medications  . hydrocortisone-pramoxine (ANALPRAM HC) 2.5-1 % rectal cream    Sig: Place 1 application rectally 3 (three) times daily.    Dispense:  30 g    Refill:  0    Order Specific Question:   Supervising Provider    Answer:   Eustace Moore [[id number]]  . HYDROcodone-acetaminophen (NORCO) 7.5-325 MG tablet    Sig: Take 1 tablet by mouth every 4 (four) hours as needed.    Dispense:  20 tablet    Refill:  0    Order Specific Question:   Supervising Provider    Answer:   Eustace Moore [[id number]]       Hayden Rasmussen, NP 11/30/16 1620

## 2016-11-30 NOTE — Discharge Instructions (Signed)
Keep the packing on top of the hemorrhoid. Because of the incision made to the external hemorrhoid you have some bleeding for the next couple of days. Be sure to clean the area at least once or twice a day with warm water. Apply the cream prescribed. This should help with shrinking, inflammation and some pain. Pain medicines as directed. Call the surgeon tomorrow to set up an appointment.

## 2016-11-30 NOTE — ED Triage Notes (Signed)
[facility] a [facility].

## 2016-12-01 NOTE — ED Provider Notes (Addendum)
  [facility]   [id number] 11/28/16 Arrival Time: 1337  ASSESSMENT & PLAN:  1. Acute hemorrhoid    No incision needed. Recommend hydrocortisone if needed. Hot bath prn. Agrees to return if not improving over the next 48-72 hours.  Reviewed expectations re: course of current medical issues. Questions answered. Outlined signs and symptoms indicating need for more acute intervention. Patient verbalized understanding. After Visit Summary given.   SUBJECTIVE:  Tyler Brooks is a 31 y.o. male who presents with complaint of possible hemorrhoid. Describes rectal pain with BM. Some bright red blood. H/O hemorrhoids with similar symptoms. No abdominal symptoms. Does not describe significant straining with stools. No self tx. Prolonged sitting exacerbates rectal discomfort.  ROS: As per HPI.   OBJECTIVE:  Vitals:   11/28/16 1404  BP: (!) 115/54  Pulse: 72  Resp: 18  Temp: 98.6 F (37 C)  TempSrc: Oral  SpO2: 99%     General appearance: alert; no distress Abdomen: soft, non-tender; bowel sounds normal; no masses or organomegaly; no guarding or rebound tenderness Rectal: small hemorrhoid visible; non-thrombosed; less than 0.5cm; some tenderness but he tolerates exam well; no active bleeding Skin: warm and dry  No Known Allergies  PMHx, SurgHx, SocialHx, Medications, and Allergies were reviewed in the Visit Navigator and updated as appropriate.      Mardella LaymanHagler, Neda Willenbring, MD 12/01/16 [id number]    Mardella LaymanHagler, Dorothee Napierkowski, MD 12/01/16 [phone]

## 2016-12-01 NOTE — ED Notes (Signed)
MR. Tyler Brooks gives this nurse permission to fax demographic information and notes from previous visits [facility]. [facility] given PT's phone number so that they can contact him for an appointment

## 2017-06-01 ENCOUNTER — Other Ambulatory Visit: Payer: Self-pay

## 2017-06-01 ENCOUNTER — Encounter (HOSPITAL_BASED_OUTPATIENT_CLINIC_OR_DEPARTMENT_OTHER): Payer: Self-pay

## 2017-06-01 ENCOUNTER — Emergency Department (HOSPITAL_BASED_OUTPATIENT_CLINIC_OR_DEPARTMENT_OTHER)
Admission: EM | Admit: 2017-06-01 | Discharge: 2017-06-01 | Disposition: A | Discharge status: Home / Self Care | Payer: 59 | Attending: Emergency Medicine | Admitting: Emergency Medicine

## 2017-06-01 ENCOUNTER — Emergency Department (HOSPITAL_BASED_OUTPATIENT_CLINIC_OR_DEPARTMENT_OTHER): Payer: 59

## 2017-06-01 DIAGNOSIS — J111 Influenza due to unidentified influenza virus with other respiratory manifestations: Secondary | ICD-10-CM

## 2017-06-01 DIAGNOSIS — R509 Fever, unspecified: Secondary | ICD-10-CM | POA: Diagnosis present

## 2017-06-01 DIAGNOSIS — R69 Illness, unspecified: Secondary | ICD-10-CM

## 2017-06-01 LAB — COMPREHENSIVE METABOLIC PANEL
ALBUMIN: 3.9 g/dL (ref 3.5–5.0)
ALK PHOS: 76 U/L (ref 38–126)
ALT: 40 U/L (ref 17–63)
AST: 44 U/L — AB (ref 15–41)
Anion gap: 8 (ref 5–15)
BILIRUBIN TOTAL: 0.5 mg/dL (ref 0.3–1.2)
BUN: 14 mg/dL (ref 6–20)
CALCIUM: 8.9 mg/dL (ref 8.9–10.3)
CO2: 26 mmol/L (ref 22–32)
Chloride: 101 mmol/L (ref 101–111)
Creatinine, Ser: 1.2 mg/dL (ref 0.61–1.24)
GFR calc Af Amer: >60 mL/min (ref >60)
GFR calc non Af Amer: >60 mL/min (ref >60)
GLUCOSE: 114 mg/dL — AB (ref 65–99)
Potassium: 3.6 mmol/L (ref 3.5–5.1)
SODIUM: 135 mmol/L (ref 135–145)
TOTAL PROTEIN: 7.7 g/dL (ref 6.5–8.1)

## 2017-06-01 LAB — CBC WITH DIFFERENTIAL/PLATELET
BASOS ABS: 0 10*3/uL (ref 0.0–0.1)
BASOS PCT: 0 %
Eosinophils Absolute: 0 10*3/uL (ref 0.0–0.7)
Eosinophils Relative: 0 %
HEMATOCRIT: 35.8 % — AB (ref 39.0–52.0)
HEMOGLOBIN: 12.8 g/dL — AB (ref 13.0–17.0)
Lymphocytes Relative: 20 %
Lymphs Abs: 1.9 10*3/uL (ref 0.7–4.0)
MCH: 32.4 pg (ref 26.0–34.0)
MCHC: 35.8 g/dL (ref 30.0–36.0)
MCV: 90.6 fL (ref 78.0–100.0)
Monocytes Absolute: 0.7 10*3/uL (ref 0.1–1.0)
Monocytes Relative: 7 %
NEUTROS PCT: 73 %
Neutro Abs: 7 10*3/uL (ref 1.7–7.7)
Platelets: 288 10*3/uL (ref 150–400)
RBC: 3.95 MIL/uL — ABNORMAL LOW (ref 4.22–5.81)
RDW: 11.9 % (ref 11.5–15.5)
WBC: 9.7 10*3/uL (ref 4.0–10.5)

## 2017-06-01 LAB — INFLUENZA PANEL BY PCR (TYPE A & B)
Influenza A By PCR: NEGATIVE
Influenza B By PCR: NEGATIVE

## 2017-06-01 MED ORDER — ONDANSETRON 4 MG PO TBDP: 4.0000 mg | ORAL_TABLET | Freq: Three times a day (TID) | ORAL | 0 refills | Status: AC | PRN

## 2017-06-01 MED ORDER — SODIUM CHLORIDE 0.9 % IV BOLUS (SEPSIS)
1000.0000 mL | Freq: Once | INTRAVENOUS | Status: AC
  Administered 2017-06-01: 1000 mL via INTRAVENOUS

## 2017-06-01 MED ORDER — DIPHENHYDRAMINE HCL 50 MG/ML IJ SOLN
25.0000 mg | Freq: Once | INTRAMUSCULAR | Status: AC
  Administered 2017-06-01: 25 mg via INTRAVENOUS
  Filled 2017-06-01: qty 1

## 2017-06-01 MED ORDER — ONDANSETRON HCL 4 MG/2ML IJ SOLN
4.0000 mg | Freq: Once | INTRAMUSCULAR | Status: AC
  Administered 2017-06-01: 4 mg via INTRAVENOUS
  Filled 2017-06-01: qty 2

## 2017-06-01 MED ORDER — KETOROLAC TROMETHAMINE 30 MG/ML IJ SOLN
30.0000 mg | Freq: Once | INTRAMUSCULAR | Status: AC
  Administered 2017-06-01: 30 mg via INTRAVENOUS
  Filled 2017-06-01: qty 1

## 2017-06-01 MED ORDER — ACETAMINOPHEN 325 MG PO TABS
650.0000 mg | ORAL_TABLET | Freq: Once | ORAL | Status: AC
  Administered 2017-06-01: 650 mg via ORAL
  Filled 2017-06-01: qty 2

## 2017-06-01 MED ORDER — METOCLOPRAMIDE HCL 5 MG/ML IJ SOLN
10.0000 mg | Freq: Once | INTRAMUSCULAR | Status: AC
  Administered 2017-06-01: 10 mg via INTRAVENOUS
  Filled 2017-06-01: qty 2

## 2017-06-01 NOTE — Discharge Instructions (Signed)
You may alternate Tylenol 1000 mg every 6 hours as needed for fever and pain and ibuprofen 800 mg every 8 hours as needed for fever and pain. Please rest and drink plenty of fluids. This is a viral illness causing your symptoms. You do not need antibiotics for a virus. You may use over-the-counter nasal saline spray and Afrin nasal saline spray as needed for nasal congestion. Please do not use Afrin for more than 3 days in a row. You may use Mucinex and Dextromethorphan as needed for cough.  You may use lozenges and Chloraseptic spray to help with sore throat.  Warm salt water gargles can also help with sore throat.  You may use over-the-counter Unisom (doxyalamine) or Benadryl to help with sleep.  Please note that some combination medicines such as DayQuil and NyQuil have multiple medications in them.  Please make sure you look at all labels to ensure that you are not taking too much of any one particular medication.  Symptoms from a virus may take 7-14 days to run its course. ° °We do not test for the flu from the emergency department as we do not have rapid flu swabs and it takes hours for this test to come back and it would not change our management. The flu is treated like any other virus with supportive measures as listed above. At this time you are outside the treatment window for Tamiflu. Tamiflu has to be taken within the first 48 hours of symptoms.  Tamiflu has many side effects including nausea, vomiting and diarrhea. ° °

## 2017-06-01 NOTE — ED Triage Notes (Signed)
Pt c/o headache since Monday, body aches and congestion and fever that started today, pt last had OTC meds at 1000

## 2017-06-01 NOTE — ED Notes (Signed)
C/o body aches, ha, nausea, fever onset 3 days ago and getting worse

## 2017-06-01 NOTE — ED Provider Notes (Signed)
TIME SEEN: 1:28 AM  CHIEF COMPLAINT: Fever, body aches, congestion, headache  HPI: Patient is a 32 year old male with no significant past medical history who presents to the emergency department with complaints of 1 week of fever, body aches, nasal congestion, dry cough, headache.  Multiple family members with similar symptoms.  No recent travel.  He has not had an influenza vaccination this year.  Has had nausea but no vomiting or diarrhea.  No abdominal pain.  No dysuria or hematuria.  Has tried Mucinex, DayQuil, NyQuil without much relief.  ROS: See HPI Constitutional:  fever  Eyes: no drainage  ENT: no runny nose   Cardiovascular:  no chest pain  Resp: no SOB  GI: no vomiting GU: no dysuria Integumentary: no rash  Allergy: no hives  Musculoskeletal: no leg swelling  Neurological: no slurred speech ROS otherwise negative  PAST MEDICAL HISTORY/PAST SURGICAL HISTORY:  Past Medical History:  Diagnosis Date  . Allergy   . Elevated LFTs   . Gynecomastia, male 2000   bilateral, R > L - "getting better as a result of weight loss"    MEDICATIONS:  Prior to Admission medications   Medication Sig Start Date End Date Taking? Authorizing Provider  HYDROcodone-acetaminophen (NORCO) 7.5-325 MG tablet Take 1 tablet by mouth every 4 (four) hours as needed. 11/30/16   Hayden Rasmussen, NP  hydrocortisone-pramoxine (ANALPRAM HC) 2.5-1 % rectal cream Place 1 application rectally 3 (three) times daily. 11/30/16   Hayden Rasmussen, NP  ibuprofen (ADVIL,MOTRIN) 600 MG tablet Take 1 tablet (600 mg total) by mouth every 6 (six) hours as needed. 05/11/16   Audry Pili, PA-C    ALLERGIES:  No Known Allergies  SOCIAL HISTORY:  Social History   Tobacco Use  . Smoking status: Never Smoker  . Smokeless tobacco: Never Used  Substance Use Topics  . Alcohol use: Yes    Comment: Occas.    FAMILY HISTORY: Family History  Problem Relation Age of Onset  . Diverticulitis Mother   . Diabetes Father   .  Hypertension Father   . Hypertension Paternal Grandmother   . Diabetes Paternal Grandmother   . Cancer Maternal Aunt 45       breast  . COPD Maternal Grandmother     EXAM: BP 135/71 (BP Location: Left Arm)   Pulse (!) 101   Temp (!) 100.5 F (38.1 C) (Oral)   Resp 18   Ht 5\' 8"  (1.727 m)   Wt 96.6 kg (213 lb)   SpO2 96%   BMI 32.39 kg/m  CONSTITUTIONAL: Alert and oriented and responds appropriately to questions. Well-appearing; well-nourished HEAD: Normocephalic EYES: Conjunctivae clear, pupils appear equal, EOMI ENT: normal nose; moist mucous membranes NECK: Supple, no meningismus, no nuchal rigidity, no LAD  CARD: RRR; S1 and S2 appreciated; no murmurs, no clicks, no rubs, no gallops RESP: Normal chest excursion without splinting or tachypnea; breath sounds clear and equal bilaterally; no wheezes, no rhonchi, no rales, no hypoxia or respiratory distress, speaking full sentences ABD/GI: Normal bowel sounds; non-distended; soft, non-tender, no rebound, no guarding, no peritoneal signs, no hepatosplenomegaly BACK:  The back appears normal and is non-tender to palpation, there is no CVA tenderness EXT: Normal ROM in all joints; non-tender to palpation; no edema; normal capillary refill; no cyanosis, no calf tenderness or swelling    SKIN: Normal color for age and race; warm; no rash NEURO: Moves all extremities equally PSYCH: The patient's mood and manner are appropriate. Grooming and personal hygiene are appropriate.  MEDICAL DECISION MAKING: Patient here with flulike symptoms.  Doubt meningitis, pneumonia, sepsis.  We will treat symptomatically with IV fluids, Toradol, Zofran and reassess.  A chest x-ray obtained in triage is unremarkable and negative for pneumonia, edema, pneumothorax.  ED PROGRESS: Labs unremarkable.  No leukocytosis.  Flu swab is pending but would not change our management.  He is outside treatment window for Tamiflu.  Reports feeling somewhat better after  Toradol, IV fluids and Zofran but still having mild headache.  Will give Reglan and Benadryl and reassess.    Patient's headache now completely gone after Reglan and Benadryl.  Again doubt meningitis, encephalitis.  I feel he is safe to be discharged home.  Recommend alternating Tylenol Motrin.  Recommend increase fluid intake.  Will provide work note.  Patient comfortable with this plan.  Have advised him not to drive his vehicle home since he received Benadryl.  His wife will take him home today.    At this time, I do not feel there is any life-threatening condition present. I have reviewed and discussed all results (EKG, imaging, lab, urine as appropriate) and exam findings with patient/family. I have reviewed nursing notes and appropriate previous records.  I feel the patient is safe to be discharged home without further emergent workup and can continue workup as an outpatient as needed. Discussed usual and customary return precautions. Patient/family verbalize understanding and are comfortable with this plan.  Outpatient follow-up has been provided if needed. All questions have been answered.      Antavious Spanos, Layla MawKristen N, DO 06/01/17 [phone]

## 2018-05-01 ENCOUNTER — Encounter (HOSPITAL_BASED_OUTPATIENT_CLINIC_OR_DEPARTMENT_OTHER): Payer: Self-pay | Admitting: *Deleted

## 2018-05-01 ENCOUNTER — Other Ambulatory Visit: Payer: Self-pay

## 2018-05-01 ENCOUNTER — Emergency Department (HOSPITAL_BASED_OUTPATIENT_CLINIC_OR_DEPARTMENT_OTHER)
Admission: EM | Admit: 2018-05-01 | Discharge: 2018-05-01 | Disposition: A | Discharge status: Home / Self Care | Payer: 59 | Attending: Emergency Medicine | Admitting: Emergency Medicine

## 2018-05-01 DIAGNOSIS — Z79899 Other long term (current) drug therapy: Secondary | ICD-10-CM | POA: Diagnosis not present

## 2018-05-01 DIAGNOSIS — J069 Acute upper respiratory infection, unspecified: Secondary | ICD-10-CM | POA: Diagnosis not present

## 2018-05-01 DIAGNOSIS — R05 Cough: Secondary | ICD-10-CM | POA: Diagnosis present

## 2018-05-01 MED ORDER — PREDNISONE 10 MG PO TABS: 40.0000 mg | ORAL_TABLET | Freq: Every day | ORAL | 0 refills | Status: AC

## 2018-05-01 MED ORDER — BENZONATATE 100 MG PO CAPS: 100.0000 mg | ORAL_CAPSULE | Freq: Three times a day (TID) | ORAL | 0 refills | Status: AC

## 2018-05-01 MED ORDER — FLUTICASONE PROPIONATE 50 MCG/ACT NA SUSP: 1.0000 | Freq: Every day | NASAL | 0 refills | Status: AC

## 2018-05-01 MED ORDER — PREDNISONE 50 MG PO TABS
50.0000 mg | ORAL_TABLET | Freq: Once | ORAL | Status: AC
  Administered 2018-05-01: 50 mg via ORAL
  Filled 2018-05-01: qty 1

## 2018-05-01 MED ORDER — PROMETHAZINE-DM 6.25-15 MG/5ML PO SYRP
5.0000 mL | ORAL_SOLUTION | Freq: Four times a day (QID) | ORAL | 0 refills | Status: AC | PRN
Start: 2018-05-01 — End: ?

## 2018-05-01 NOTE — Discharge Instructions (Signed)
You likely have a viral illness.  This should be treated symptomatically. Use Tylenol or ibuprofen as needed for fevers or body aches. Use Flonase daily for nasal congestion and cough. Take prednisone as prescribed.  Use cough drops and syrups as needed.  Make sure you stay well-hydrated with water. Wash your hands frequently to prevent spread of infection. Follow-up with your primary care doctor in 1 week if your symptoms are not improving. Return to the emergency room if you develop chest pain, difficulty breathing, or any new or worsening symptoms.

## 2018-05-01 NOTE — ED Notes (Signed)
Pt. Has been taking OTC medication with Tylenol for flu like symptoms to help him.

## 2018-05-01 NOTE — ED Triage Notes (Signed)
Cough and body aches for over a week.

## 2018-05-01 NOTE — ED Provider Notes (Signed)
[facility] EMERGENCY DEPARTMENT Provider Note   CSN: [id number] Arrival date & time: 05/01/18  1939     History   Chief Complaint Chief Complaint  Patient presents with  . Cough  . Generalized Body Aches    HPI Ercell Razon is a 32 y.o. male presenting for evaluation of cough, sore throat, nasal congestion, general body aches.  Patient states a week ago, he started to develop sore throat, nasal congestion, and cough.  Cough was initially productive, but is now less productive.  Cough is worse at night and when he first wakes up in the morning.  He reports postnasal drip, but no significant congestion.  Sore throat has improved.  No fevers or chills at home.  Patient has continued generalized body aches.  He reports his son was sick with a URI symptoms last week.  Patient denies tobacco use.  Denies history of asthma or COPD.  Takes no medications daily.  He denies ear pain, chest pain, shortness of breath, nausea, vomiting, abdominal pain, urinary symptoms, abnormal bowel movements.  HPI  Past Medical History:  Diagnosis Date  . Allergy   . Elevated LFTs   . Gynecomastia, male 2000   bilateral, R > L - "getting better as a result of weight loss"    There are no active problems to display for this patient.   History reviewed. No pertinent surgical history.      Home Medications    Prior to Admission medications   Medication Sig Start Date End Date Taking? Authorizing Provider  benzonatate (TESSALON) 100 MG capsule Take 1 capsule (100 mg total) by mouth every 8 (eight) hours. 05/01/18   Harles Evetts, PA-C  fluticasone (FLONASE) 50 MCG/ACT nasal spray Place 1 spray into both nostrils daily. 05/01/18   Joshuwa Vecchio, PA-C  HYDROcodone-acetaminophen (NORCO) 7.5-325 MG tablet Take 1 tablet by mouth every 4 (four) hours as needed. 11/30/16   Hayden Rasmussen, NP  hydrocortisone-pramoxine (ANALPRAM HC) 2.5-1 % rectal cream Place 1 application rectally 3  (three) times daily. 11/30/16   Hayden Rasmussen, NP  ibuprofen (ADVIL,MOTRIN) 600 MG tablet Take 1 tablet (600 mg total) by mouth every 6 (six) hours as needed. 05/11/16   Audry Pili, PA-C  ondansetron (ZOFRAN ODT) 4 MG disintegrating tablet Take 1 tablet (4 mg total) by mouth every 8 (eight) hours as needed for nausea or vomiting. 06/01/17   Ward, Layla Maw, DO  predniSONE (DELTASONE) 10 MG tablet Take 4 tablets (40 mg total) by mouth daily for 4 days. 05/01/18 05/05/18  Wanda Cellucci, PA-C  promethazine-dextromethorphan (PROMETHAZINE-DM) 6.25-15 MG/5ML syrup Take 5 mLs by mouth 4 (four) times daily as needed for cough. 05/01/18   Miral Hoopes, PA-C    Family History Family History  Problem Relation Age of Onset  . Diverticulitis Mother   . Diabetes Father   . Hypertension Father   . Hypertension Paternal Grandmother   . Diabetes Paternal Grandmother   . Cancer Maternal Aunt 45       breast  . COPD Maternal Grandmother     Social History Social History   Tobacco Use  . Smoking status: Never Smoker  . Smokeless tobacco: Never Used  Substance Use Topics  . Alcohol use: Yes    Comment: Occas.  . Drug use: No     Allergies   Patient has no known allergies.   Review of Systems Review of Systems  HENT: Positive for congestion and sore throat (resolved).   Respiratory: Positive for cough.  Musculoskeletal: Positive for myalgias.  All other systems reviewed and are negative.    Physical Exam Updated Vital Signs BP 126/78 (BP Location: Right Arm)   Pulse 89   Temp 98.6 F (37 C) (Oral)   Resp 18   Ht 5\' 8"  (1.727 m)   Wt 106.1 kg   SpO2 98%   BMI 35.58 kg/m   Physical Exam Vitals signs and nursing note reviewed.  Constitutional:      General: He is not in acute distress.    Appearance: He is well-developed.     Comments: Sitting comfortably in the bed in no acute distress  HENT:     Head: Normocephalic and atraumatic.     Comments: Be clear without  tonsillar swelling or exudate.  Uvula midline with equal palate rise.  TMs nonerythematous and nonbulging bilaterally.  Mild nasal mucosal edema    Right Ear: Tympanic membrane, ear canal and external ear normal.     Left Ear: Tympanic membrane, ear canal and external ear normal.     Nose: Mucosal edema and congestion present.     Right Sinus: No maxillary sinus tenderness or frontal sinus tenderness.     Left Sinus: No maxillary sinus tenderness or frontal sinus tenderness.     Mouth/Throat:     Pharynx: Uvula midline.     Tonsils: No tonsillar exudate.  Eyes:     Conjunctiva/sclera: Conjunctivae normal.     Pupils: Pupils are equal, round, and reactive to light.  Neck:     Musculoskeletal: Normal range of motion.  Cardiovascular:     Rate and Rhythm: Normal rate and regular rhythm.  Pulmonary:     Effort: Pulmonary effort is normal.     Breath sounds: Normal breath sounds. No decreased breath sounds, wheezing, rhonchi or rales.     Comments: Speaking in full sentences.  Clear lung sounds in all fields. Abdominal:     General: There is no distension.     Palpations: Abdomen is soft.     Tenderness: There is no abdominal tenderness.  Musculoskeletal: Normal range of motion.  Lymphadenopathy:     Cervical: No cervical adenopathy.  Skin:    General: Skin is warm.     Capillary Refill: Capillary refill takes less than 2 seconds.  Neurological:     Mental Status: He is alert and oriented to person, place, and time.      ED Treatments / Results  Labs (all labs ordered are listed, but only abnormal results are displayed) Labs Reviewed - No data to display  EKG None  Radiology No results found.  Procedures Procedures (including critical care time)  Medications Ordered in ED Medications  predniSONE (DELTASONE) tablet 50 mg (has no administration in time range)     Initial Impression / Assessment and Plan / ED Course  I have reviewed the triage vital signs and the  nursing notes.  Pertinent labs & imaging results that were available during my care of the patient were reviewed by me and considered in my medical decision making (see chart for details).     Patient presenting with 1 wk h/o URI symptoms.  Physical exam reassuring, patient is afebrile and appears nontoxic.  Pulmonary exam reassuring.  Doubt pneumonia, strep, other bacterial infection, or peritonsillar abscess. Considering improvement of some sxs and increase in dry cough, consider bronchitis. Sxs still likely viral.  Will treat symptomatically.  Patient to follow-up with primary care as needed.  At this time, patient appears safe for  discharge.  Return precautions given.  Patient states he understands and agrees to plan.   Final Clinical Impressions(s) / ED Diagnoses   Final diagnoses:  Upper respiratory tract infection, unspecified type    ED Discharge Orders         Ordered    predniSONE (DELTASONE) 10 MG tablet  Daily     05/01/18 2036    fluticasone (FLONASE) 50 MCG/ACT nasal spray  Daily     05/01/18 2036    benzonatate (TESSALON) 100 MG capsule  Every 8 hours     05/01/18 2036    promethazine-dextromethorphan (PROMETHAZINE-DM) 6.25-15 MG/5ML syrup  4 times daily PRN     05/01/18 2036           Alveria ApleyCaccavale, Anysa Tacey, PA-C 05/01/18 2048    Maia PlanLong, Joshua G, MD 05/02/18 [phone]

## 2019-03-06 IMAGING — DX DG CHEST 2V
2 series · 2 of 2 positions shown · non-contrast
Comparison: 02/19/2013 chest radiograph

CLINICAL DATA: 31 y/o M; body aches, congestion, fever, and
headaches for 3 days.

EXAM:
CHEST  2 VIEW

[chest pa]
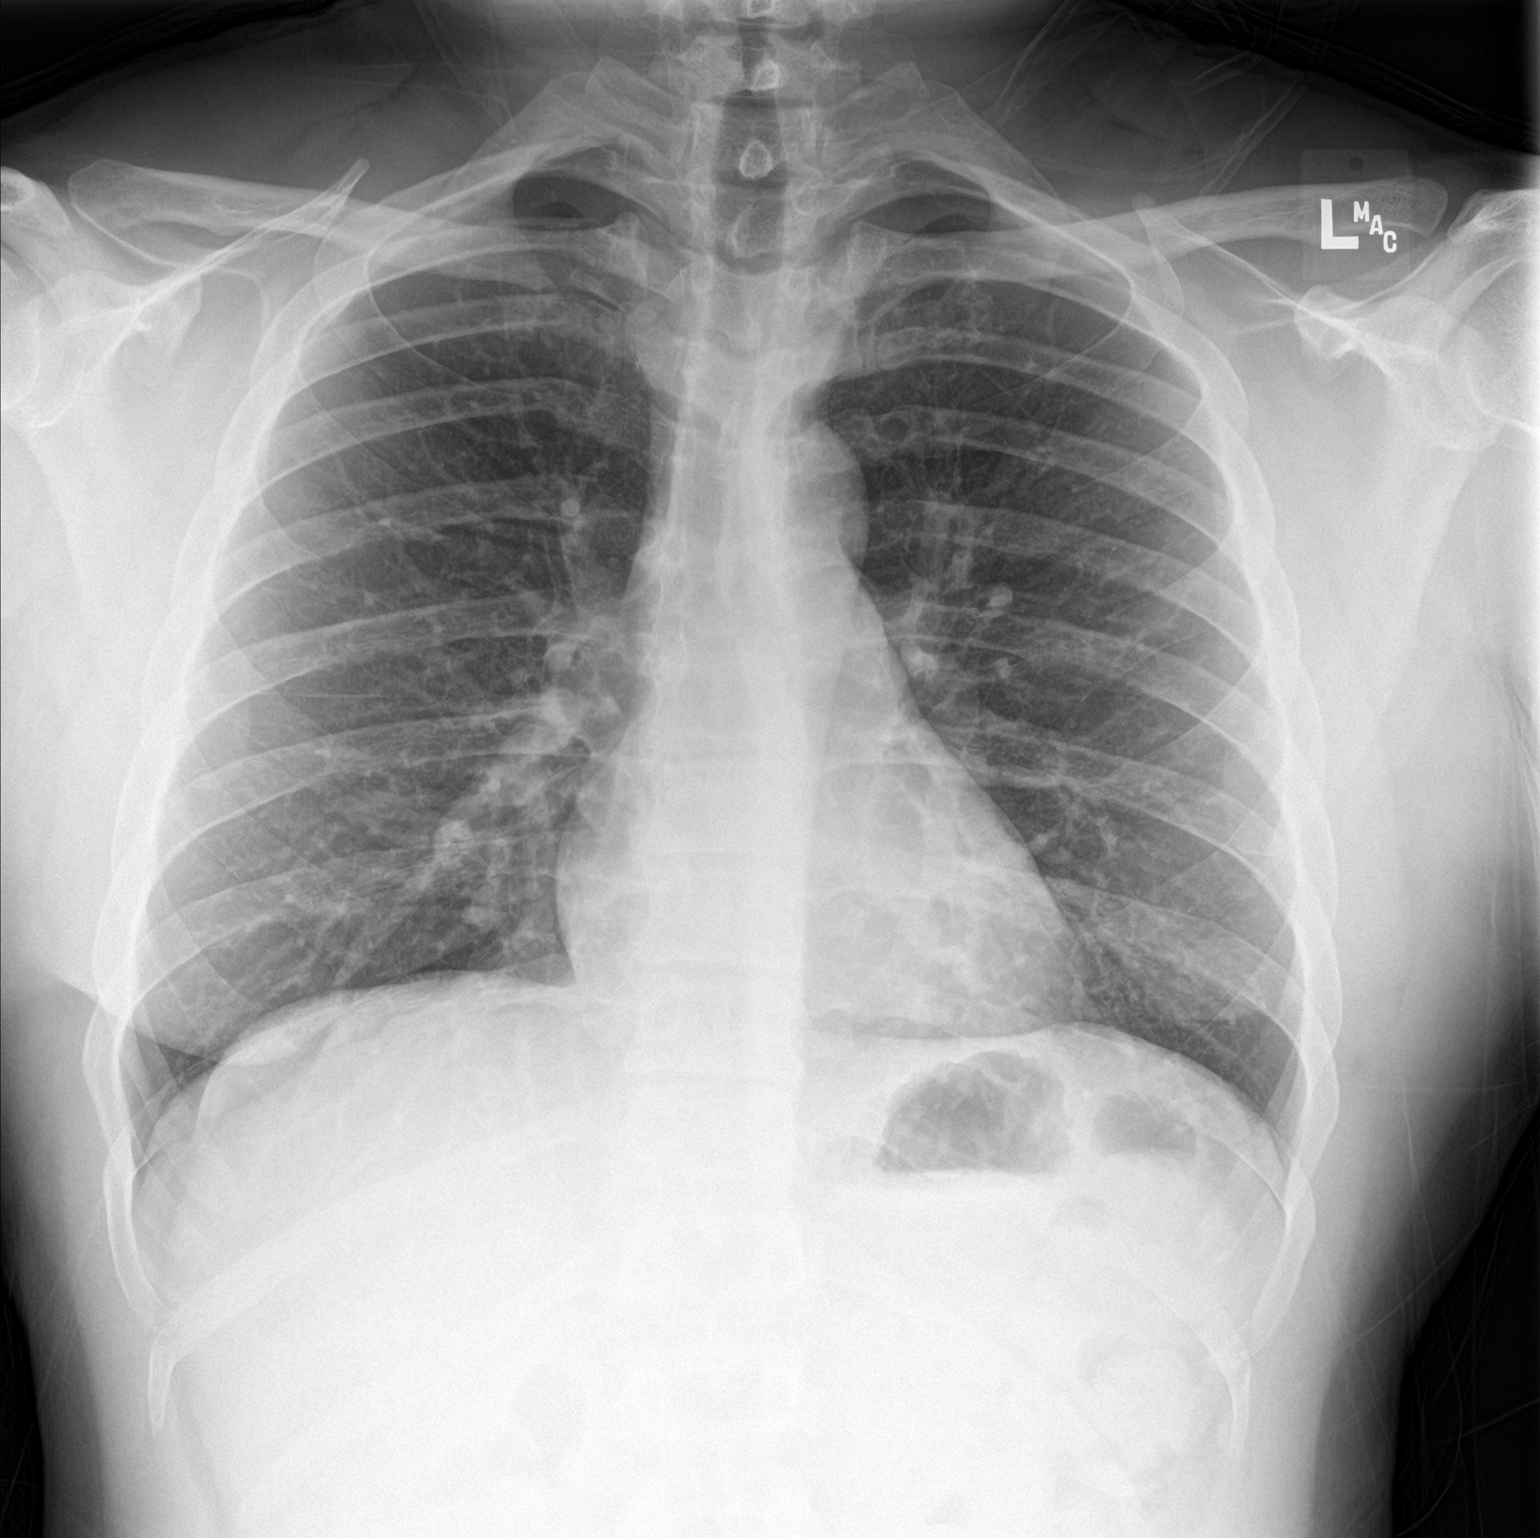

[chest lat]
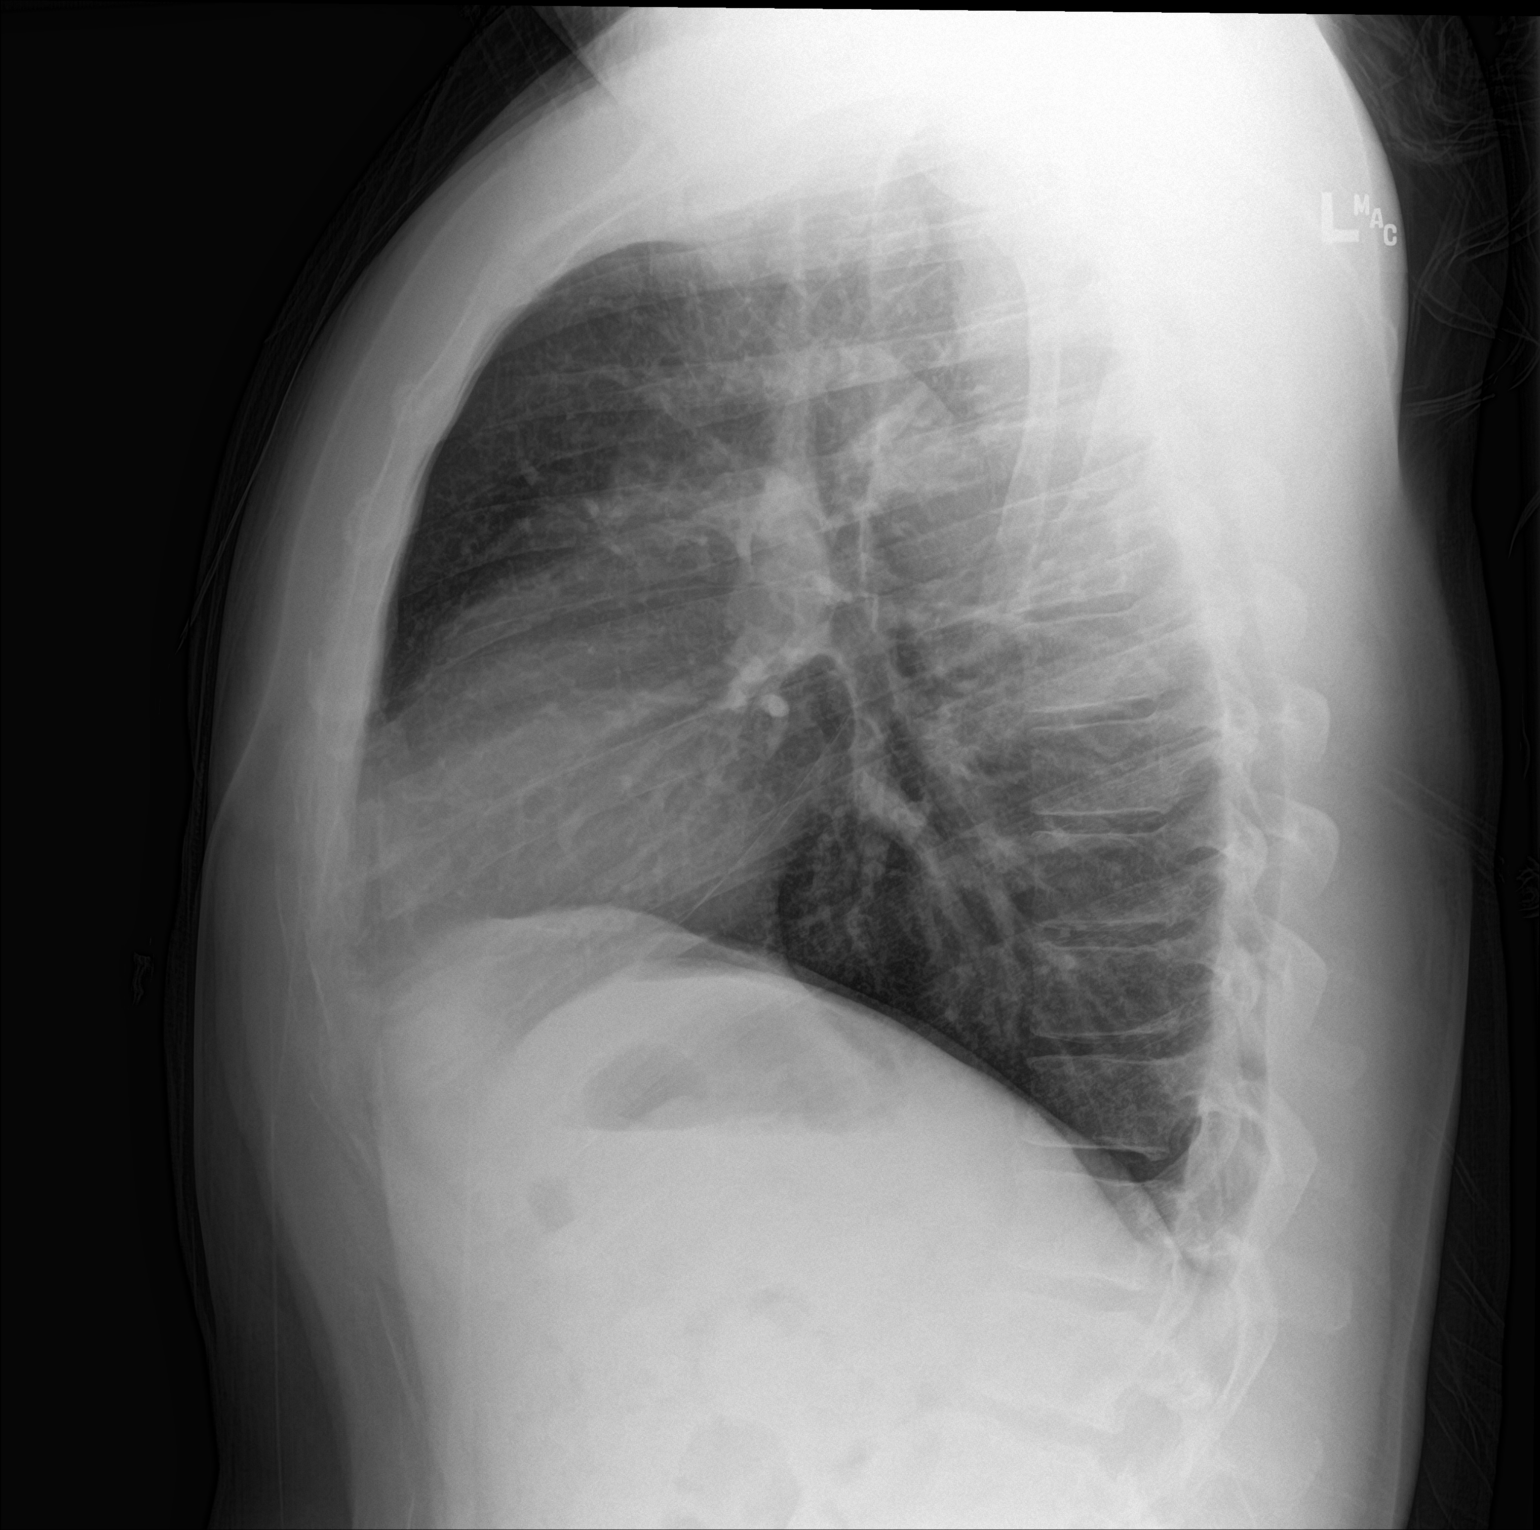

[2 of 2 positions shown; findings below may reference images not displayed]

FINDINGS: Stable heart size and mediastinal contours are within normal limits.
Both lungs are clear. The visualized skeletal structures are
unremarkable.
IMPRESSION: No active cardiopulmonary disease.

By: Claus Too M.D.

## 2024-06-20 ENCOUNTER — Encounter: Payer: Self-pay | Admitting: Gastroenterology

## 2024-07-29 ENCOUNTER — Ambulatory Visit: Admitting: Gastroenterology
